# Patient Record
Sex: Male | Born: 1935 | Race: White | Hispanic: No | Smoking: Former smoker
Health system: Southern US, Community
[De-identification: ages and names within clinical notes are randomized; demographics above are authoritative.]

## PROBLEM LIST (undated history)

## (undated) DIAGNOSIS — N400 Enlarged prostate without lower urinary tract symptoms: Secondary | ICD-10-CM

## (undated) DIAGNOSIS — Z8679 Personal history of other diseases of the circulatory system: Secondary | ICD-10-CM

## (undated) DIAGNOSIS — I071 Rheumatic tricuspid insufficiency: Secondary | ICD-10-CM

## (undated) DIAGNOSIS — I519 Heart disease, unspecified: Secondary | ICD-10-CM

## (undated) DIAGNOSIS — I10 Essential (primary) hypertension: Secondary | ICD-10-CM

## (undated) DIAGNOSIS — R0989 Other specified symptoms and signs involving the circulatory and respiratory systems: Secondary | ICD-10-CM

## (undated) DIAGNOSIS — I2510 Atherosclerotic heart disease of native coronary artery without angina pectoris: Secondary | ICD-10-CM

## (undated) DIAGNOSIS — E785 Hyperlipidemia, unspecified: Secondary | ICD-10-CM

## (undated) DIAGNOSIS — I447 Left bundle-branch block, unspecified: Secondary | ICD-10-CM

## (undated) HISTORY — DX: Rheumatic tricuspid insufficiency: I07.1

## (undated) HISTORY — DX: Essential (primary) hypertension: I10

## (undated) HISTORY — DX: Hyperlipidemia, unspecified: E78.5

## (undated) HISTORY — DX: Benign prostatic hyperplasia without lower urinary tract symptoms: N40.0

## (undated) HISTORY — DX: Other specified symptoms and signs involving the circulatory and respiratory systems: R09.89

## (undated) HISTORY — DX: Personal history of other diseases of the circulatory system: Z86.79

## (undated) HISTORY — DX: Left bundle-branch block, unspecified: I44.7

## (undated) HISTORY — DX: Atherosclerotic heart disease of native coronary artery without angina pectoris: I25.10

## (undated) HISTORY — DX: Heart disease, unspecified: I51.9

---

## 2002-01-22 ENCOUNTER — Ambulatory Visit (HOSPITAL_COMMUNITY): Admission: RE | Admit: 2002-01-22 | Discharge: 2002-01-22 | Payer: Self-pay | Admitting: Family Medicine

## 2003-11-13 ENCOUNTER — Inpatient Hospital Stay (HOSPITAL_COMMUNITY): Admission: EM | Admit: 2003-11-13 | Discharge: 2003-11-18 | Payer: Self-pay | Admitting: Emergency Medicine

## 2003-11-13 ENCOUNTER — Ambulatory Visit: Payer: Self-pay | Admitting: Family Medicine

## 2003-11-15 ENCOUNTER — Encounter (INDEPENDENT_AMBULATORY_CARE_PROVIDER_SITE_OTHER): Payer: Self-pay | Admitting: Cardiology

## 2003-11-17 HISTORY — PX: CORONARY ANGIOPLASTY WITH STENT PLACEMENT: SHX49

## 2004-04-29 ENCOUNTER — Emergency Department (HOSPITAL_COMMUNITY): Admission: EM | Admit: 2004-04-29 | Discharge: 2004-04-29 | Payer: Self-pay | Admitting: Emergency Medicine

## 2004-05-19 ENCOUNTER — Ambulatory Visit (HOSPITAL_COMMUNITY): Admission: RE | Admit: 2004-05-19 | Discharge: 2004-05-19 | Payer: Self-pay | Admitting: Family Medicine

## 2007-11-24 ENCOUNTER — Ambulatory Visit: Payer: Self-pay | Admitting: Family Medicine

## 2007-11-24 ENCOUNTER — Inpatient Hospital Stay (HOSPITAL_COMMUNITY): Admission: EM | Admit: 2007-11-24 | Discharge: 2007-11-26 | Payer: Self-pay | Admitting: Emergency Medicine

## 2007-11-25 ENCOUNTER — Encounter: Payer: Self-pay | Admitting: Family Medicine

## 2007-11-26 ENCOUNTER — Ambulatory Visit: Payer: Self-pay | Admitting: Vascular Surgery

## 2009-02-09 IMAGING — CR DG FOOT COMPLETE 3+V*L*
3 series · 3 of 3 positions shown · non-contrast
Comparison: None.

CLINICAL DATA: Left foot pain localizing to the great toe.  No
recent injuries.

LEFT FOOT - COMPLETE 3+ VIEW 11/24/2007:

[view not recorded (1 of 3)]
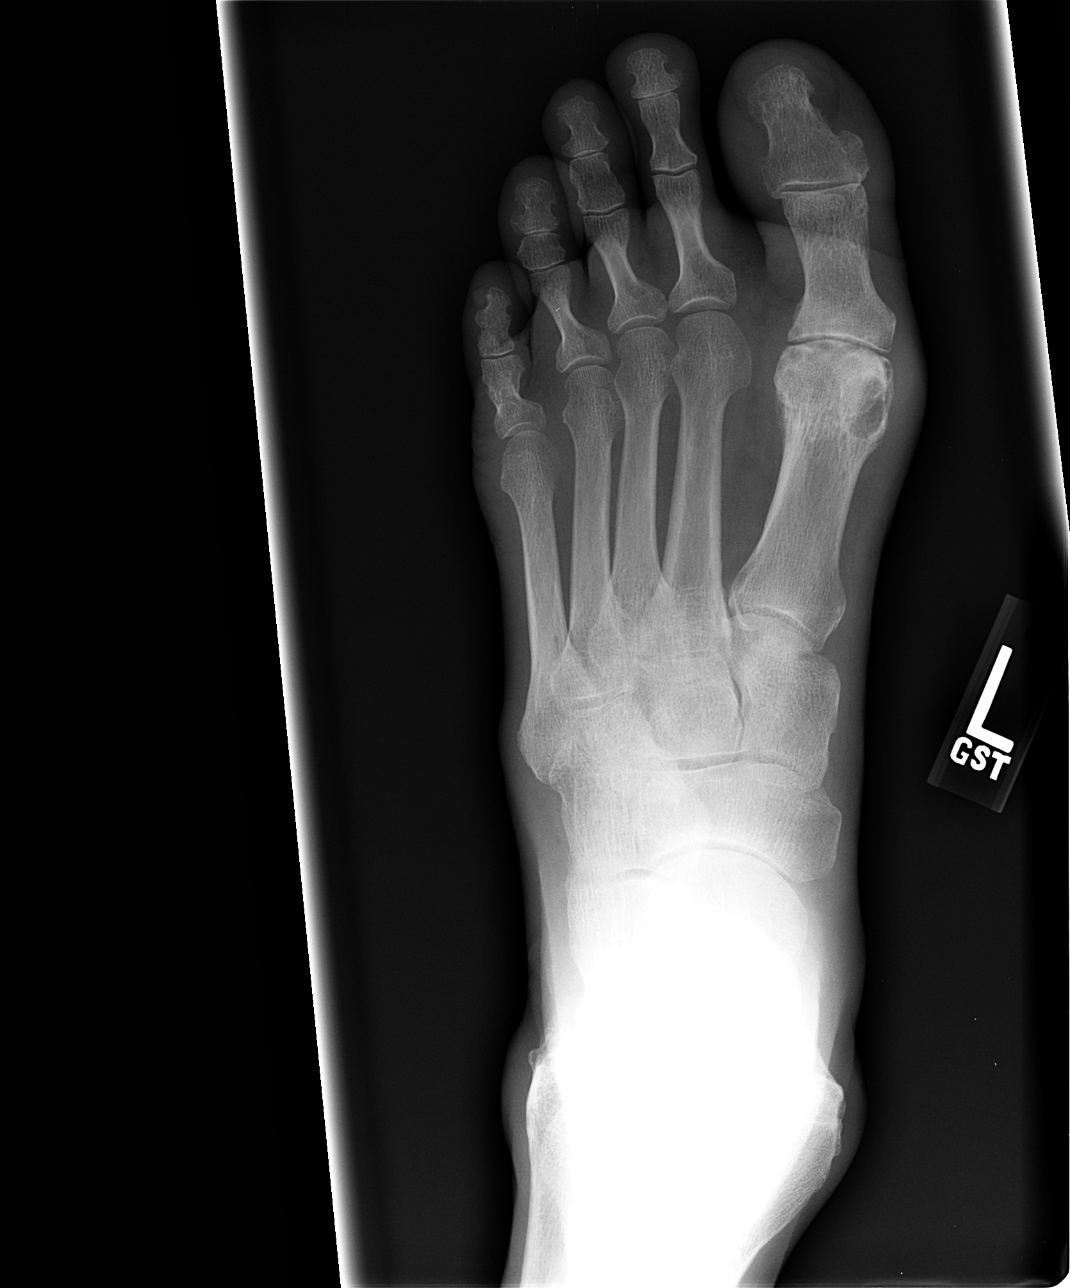

[view not recorded (2 of 3)]
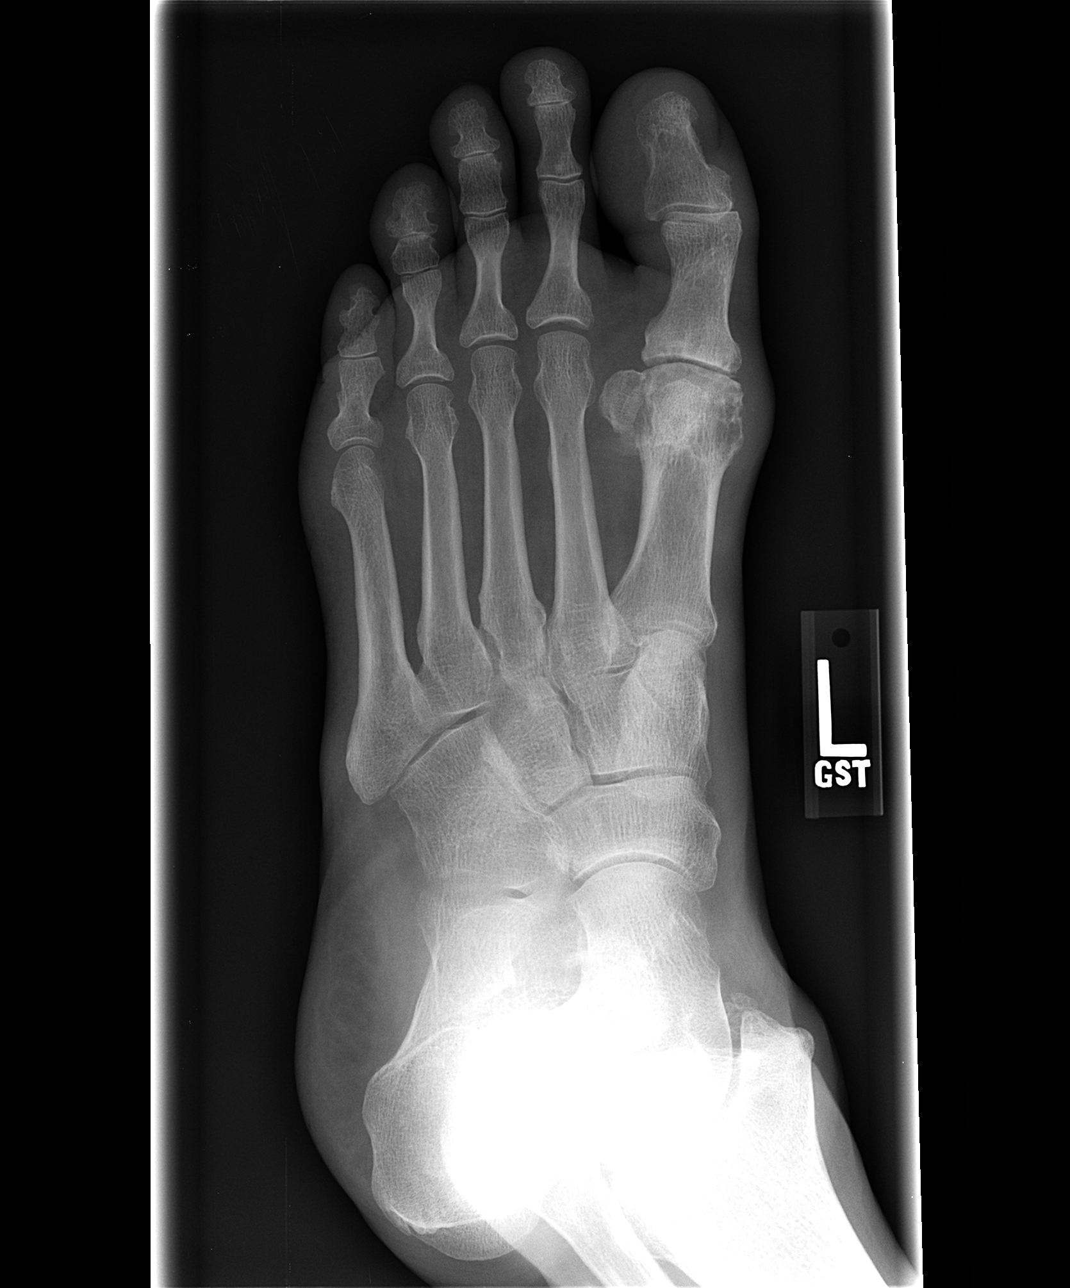

[view not recorded (3 of 3)]
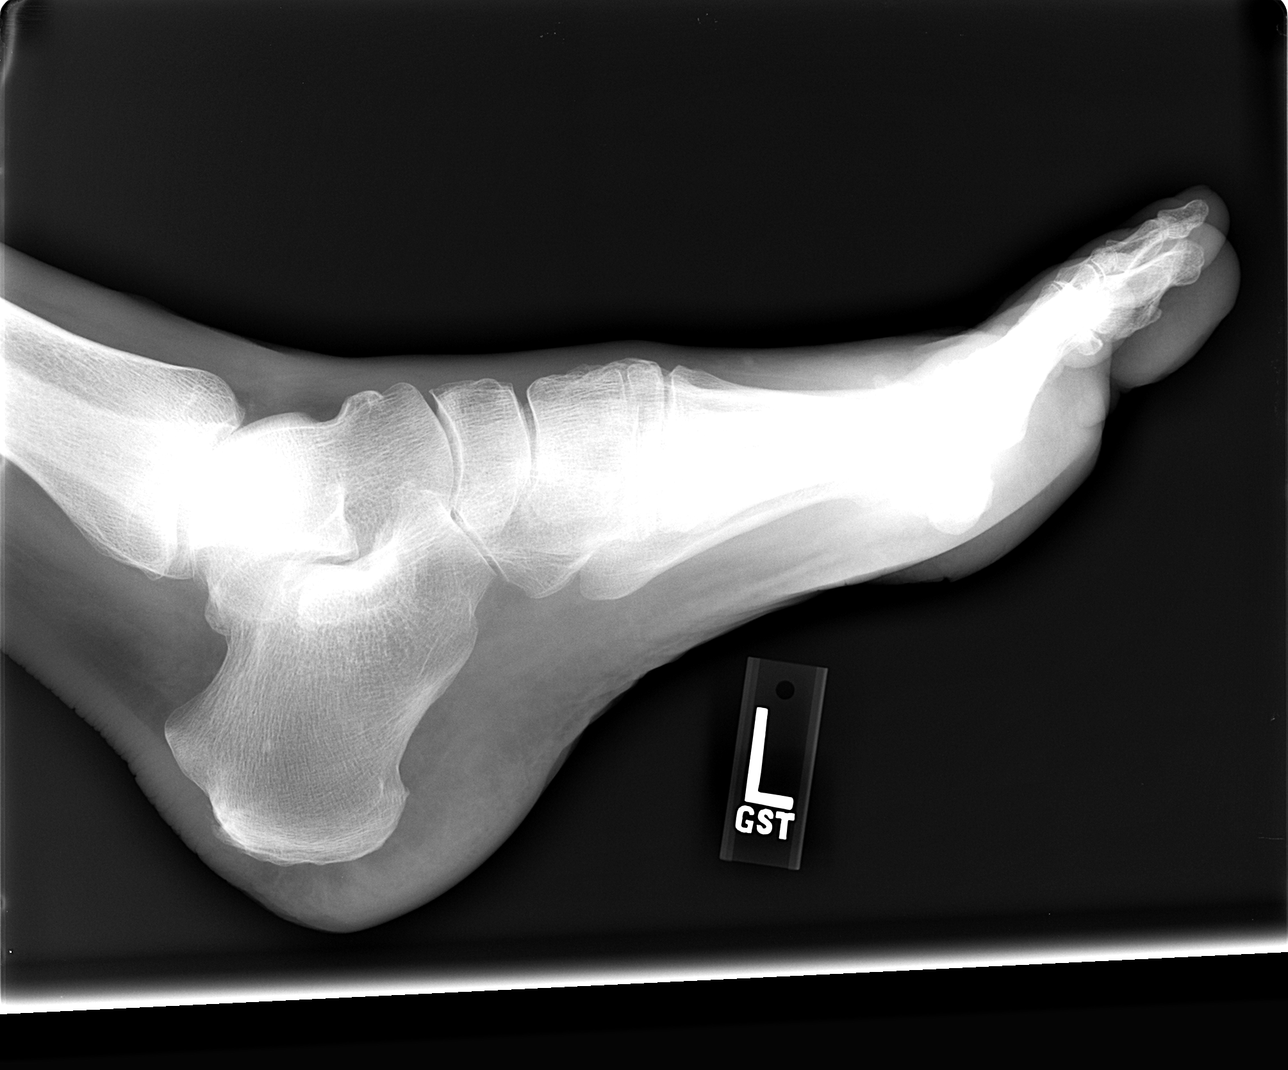

[3 of 3 positions shown; findings below may reference images not displayed]

FINDINGS: No evidence of acute or subacute fracture or dislocation.
Joint space narrowing involving the first MTP joint, with multiple
subchondral cysts, including a large geode in the head of the first
metatarsal.  No erosions.  Remaining joint spaces in the foot well
preserved.
IMPRESSION: Osteoarthritis involving the first MTP joint, moderate to severe in
degree.  No erosions to confirm a diagnosis of gout.  Remaining
joints of the foot unremarkable.

## 2009-10-13 ENCOUNTER — Ambulatory Visit (HOSPITAL_COMMUNITY): Admission: RE | Admit: 2009-10-13 | Discharge: 2009-10-13 | Payer: Self-pay | Admitting: Cardiovascular Disease

## 2009-10-13 HISTORY — PX: CARDIAC CATHETERIZATION: SHX172

## 2010-06-12 NOTE — Discharge Summary (Signed)
NAME:  Keith Jimenez, Keith Jimenez                  ACCOUNT NO.:  [id number]   MEDICAL RECORD NO.:  [id number]          PATIENT TYPE:  INP   LOCATION:  3707                         FACILITY:  [facility]   PHYSICIAN:  Keith Jimenez, M.D.DATE OF BIRTH:  [birth year]   DATE OF ADMISSION:  11/23/2007  DATE OF DISCHARGE:  11/26/2007                               DISCHARGE SUMMARY   PRIMARY CARE Keith Jimenez:  Keith Bushy, MD   DISCHARGE DIAGNOSES:  1. Left foot cellulitis.  2. Weakness.  3. Hypertension.   DISCHARGE MEDICATIONS:  1. Doxycycline 100 mg take b.i.d. for 10 days.  2. Benazepril 20 mg daily.  3. Simcor 500/20 mg take 1 tablet nightly.   DISCONTINUED MEDICATIONS:  Norvasc and benazepril combo and Cardura.   CONSULTATIONS:  Physical Therapy:  Physical Therapy saw the patient and  determined the patient to benefit from using a walker or cane for  ambulation; otherwise, he does not need any further follow up or home  physical therapy.   Reason for admission:  Pt admitted for chief complaint of weakness and  near syncope.   PROCEDURES:  None.   LABORATORY DATA:  Cardiac enzymes are negative x3, blood cultures no  growth today still pending, hemoglobin A1c 5.9, and TSH 1.052.  Admission CBC; white blood cells 10.8, hemoglobin 12.0, hematocrit 34.9,  platelets 153, and absolute neutrophil count 8.2.  Admission BMP; sodium  141, potassium 3.8, chloride 110, bicarb 25, BUN 21, creatinine 0.88,  glucose 116, and calcium 9.1.   IMAGING:  Left foot x-ray, osteoarthritis involving a first MTP joint  moderate-to-severe in degree.  No erosions to confirm the diagnosis of  gout.  Remaining joints of the foot are unremarkable.  Of note, there is  no sign of osteo.   BRIEF HOSPITAL COURSE:  1. Weakness and near syncope.  Keith Jimenez was admitted initially because      of feeling week while he was at home, although he denied ever      passing out.  He denied any chest pain or shortness of breath as   well on admission.  When he was at home feeling that he had a      neighbor, who measures blood pressure, which was found to be 80/40      at home.  He notes that he filled his prescriptions recently before      having this episode of hypertension including his blood pressure      medications, which included doxazosin 4 mg.  He notes that he took      a whole tablet instead of one half that he usually takes.  He was      also taking amlodipine and benazepril combo for his blood pressure.      On admission, we held all of his blood pressure medications and      gave him IV fluid resuscitation.  He did not have any more episodes      of weakness or near syncope while in the hospital.  We also ordered      EKGs as well as cardiac  enzymes to rule out MI on this      hospitalization.  His EKGs were normal and revealed a previous left      bundle-branch block as well as cardiac enzymes which did not      suggest the MI as they were all negative.  His blood pressures      improved during this hospitalization as we had been holding off his      blood pressure medications.  On discharge, we started him on      benazepril only due to his coronary artery disease, but held his      amlodipine as well as doxazosin because of the episode of      hypertension.  2. Left foot cellulitis.  When the patient presented to the emergency      room, he was noted to have cellulitis on his left foot as well as      some ulcerations on the plantar surface of his great toe as well as      his second toe.  Some streaking was noted on his shin as well.  He      denies any trauma to his foot.  Of note, he was taking doxycycline      previous to admission, which he said was not for cellulitis rather      for an infection that his daughter had prescribed for him 1 month      ago.  It is unclear how much doxycycline he had taken as this only      been filled a few days prior to admission.  On admission, we      stopped  his doxycycline and gave him IV vancomycin to treat the      presumed cellulitis.  He had also received 1 dose of Zosyn in the      emergency department.  During the hospitalization, the cellulitis      improved somewhat though the ulcerations were present.  He had      daily dressing changes with Bactroban ointment applied to the      ulcerations.  Upon further exam, he was found to have decreased      sensation marked in both lower extremities.  He had no ability to      distinguish moving the toe up or down, therefore decreased      proprioception as well as no ability to be distinguish sharp from      dull pinprick on his either lower extremities even including up to      his thighs.  Because of his relatively normal hemoglobin A1c at      5.9, we are not impressed and this is due to diabetic neuropathy.      We did order Dopplers of his lower extremities to evaluate for      arterial blood flow, which were found to be normal.  His pulses in      his feet and ankles were strong throughout hospitalization as well.      We recommend further follow up in the outpatient setting to      determine the cause of his neuropathy including possibly nerve      condition studies.  The decreased sensation on both of his feet      maybe the cause of the cellulitis.  No ulcerations as well as the      weakness and imbalance that he finds on walking.  We did have  Physical Therapy to evaluate the patient prior to discharge and      they found that he was able to ambulate on his own, but benefited      from using a walker or a cane.  They did not recommend any further      physical therapy in the outpatient setting, but did recommend that      he use a walker or cane at home.  3. Hyperlipidemia.  The patient was maintained on Simcor throughout      the hospitalization at his home dose.   DISCHARGE INSTRUCTIONS:  Diet:  Low sodium heart healthy.  Exercise:  Increase activity slowly and use a walker  or cane to ambulate  around the house.  Wound care:  Home health were provided dry dressing changes daily  including applying Bactroban ointment.  The home health service that he  will use is [facility], phone number [phone].   FOLLOWUP APPOINTMENTS:  Return to Dr. Purnell Shoemaker on Wednesday December 02, 2007 at 11:30 a.m. for an appointment, Dr. Harlene Salts phone number is [phone].   DISCHARGE CONDITION:  Improved and stable.      Asher Muir, MD  Electronically Signed      Keith Jimenez, M.D.  Electronically Signed    SO/MEDQ  D:  11/26/2007  T:  11/27/2007  Job:  [id number]   cc:   Keith Jimenez, M.D.

## 2010-06-12 NOTE — H&P (Signed)
NAME:  Keith Jimenez, Keith Jimenez                  ACCOUNT NO.:  [id number]   MEDICAL RECORD NO.:  [id number]          PATIENT TYPE:  INP   LOCATION:  3707                         FACILITY:  [facility]   PHYSICIAN:  Pearlean Brownie, M.D.DATE OF BIRTH:  [birth year]   DATE OF ADMISSION:  11/23/2007  DATE OF DISCHARGE:                              HISTORY & PHYSICAL   CHIEF COMPLAINT:  Weakness.   PRIMARY CARE PHYSICIAN:  Lianne Bushy, MD   CARDIOLOGY:  Cristy Hilts. Jacinto Halim, MD   HISTORY OF PRESENT ILLNESS:  This is an 77 year old male with weakness  today while feeding his wife with Cherlynn Polo disease.  He said he felt  weak in his back and also lightheaded.  Sitting down made him feel  better.  He felt like he was going to pass out.  He denied chest pain,  he denied shortness of breath.  He said he feels much better now.  He  has had a similar episode of weakness in 2005 and that was when his  stents were placed.  He says this does feel different, however, and not  as severe.  He had low blood pressure at home during this episode today.  A neighbor who works with the fire department checked his blood pressure  and it was noted to be 80/40.  Of note, the patient filled his  prescriptions today for doxazosin 4 mg and he took a whole tablet  instead of one-half as well as an amlodipine and benazepril combo. which  was also new.  He was given this prescription over a month ago, but says  he filled it today for the first time and took them all this afternoon.  Of note, the patient was noted to have a cellulitis on exam in the  emergency room.  When asked, the patient says he has had a fever in  his foot for about a day or so, however then later says he noticed some  kind of lesions on his left foot about 4 or 5 days ago.  He says that  the fever is worse today.  He denies systemic fevers.  He is not sure  how the lesion and cellulitis started.  He does not remember injuring  his foot.   PAST MEDICAL HISTORY:   Hypertension, coronary artery disease with stents  placed in 2005, hyperlipidemia, BPH.  He has an EF of 30-40% on an echo  and 53% on Cardiolite done in 2005.   MEDICATIONS:  1. Zencore 1020 nightly.  2. Doxazosin 4 mg one-half to one tab nightly.  3. Doxycycline 100 mg p.o. b.i.d. started today.  4. Amlodipine/benazepril 5/20 p.o. daily also started today.   ALLERGIES:  None.   SOCIAL HISTORY:  Lives with his wife who is with Cherlynn Polo disease.  He denies alcohol, denies tobacco use, although used to smoke and quit  many years ago, unable to give an extensive smoke history.  He is a  retired Nurse, children's.   FAMILY HISTORY:  He has had father who had a heart attack, but does not  know  any other family history.   SURGICAL:  He has stents and bilateral cataract surgery.   REVIEW OF SYSTEMS:  Denies nausea, vomiting, diarrhea, constipation,  diaphoresis, rash, headache, cough, fevers, urinary problems.  See HPI  for details.   PHYSICAL EXAMINATION:  VITAL SIGNS:  Temperature 98.3, heart rate 71-75,  blood pressure 141/74 and 112/68, respiratory rate is 16, and O2 is 96%  on room air.  GENERAL:  Not in acute distress, well appearing.  HEENT:  Pupils are equal, round, and reactive to light.  His extraocular  muscles were intact.  He has got moist mucous membranes.  NECK:  No JVD.  CARDIOVASCULAR:  Regular rate and rhythm.  No rubs, gallops, or murmurs.  PULMONARY:  Clear to auscultation bilaterally.  ABDOMEN:  Soft, nontender, positive bowel sounds.  EXTREMITIES:  See skin exam; he has no lower extremity edema except for  his left foot.  NEUROLOGIC:  He is oriented x3.  He knows who the president is.  His  cranial nerves are grossly intact.  His deep tendon reflexes are equal  bilaterally.  He has got equal sensation to light, touch in his  bilateral lower extremities.  MUSCULOSKELETAL:  He has 5/5 upper and lower extremities strength.  SKIN:  His left foot has  erythema surrounding the great toe and  streaking towards to ankle.  He has got an edema of his foot, especially  over the great toe.  The plantar surface of the foot has approximately  1.5 cm scabbed ulcer on the plantar surface of the great toe and small  ulcer on the second and fourth toes.  He also appears to have blisters  at the base of his metatarsal on the first and second toe of his left  foot.   LABORATORIES:  Lactic acid 1.1.  I-stat showed sodium 138, potassium 4,  chloride 104, bicarb 26, BUN 28, creatinine 1.3, and glucose 127.  CBC  shows a white count of 10.8, 12 hemoglobin and hematocrit 34.9, and  platelets 153.  He has ANC of 8.2.   ASSESSMENT:  An 77 year old male with weakness, and cellulitis.  Problem #1.  Weakness:  Baseline history of low blood pressure in the  field as well as 3 new blood pressure medications.  The patient likely  had symptomatic hypotension.  He is a poor historian, so I can not be  certain of this.  He did go to Dr. Purnell Shoemaker a month ago, but the meds were  filled today.  In the morning, we can assess whether the patient got  these as refill, or were they indeed new prescription, so I am calling  the PCP at the pharmacy.  I am going to hold all blood pressure  medications for now.  I will resume them one by one.  We should start  with Norvasc.  ? He is not on hydrochlorothiazide or beta blocker.  We  will keep the patient on telemonitor.  Problem #2.  Cellulitis:  Mild cellulitis at this point.  There is a low  possibility that his hypotension is related to a systemic infection.  However, it resolved without any treatment.  Blood cultures were  obtained and until we will get some preliminary results , we will  continue vancomycin.  He was given vancomycin and Zosyn in the emergency  room, however I feel we can discontinue the Zosyn as of now.  The  patient has no significant risk factors for MRSA so hopefully we can  switch him over to p.o.  dicloxacillin or Keflex soon.  If it is  methicillin-resistant Staphylococcus aureus, we will switch him to p.o.  doxycycline as started by his PCP.  The patient thinks the redness has  improved since arrival which is a good sign.  He has a normal CBC.  Tylenol for pain.  We will get left lower extremity x-ray to rule out  osteo.  We will get a wound care consult if these ulcers appear to be  unstageable ulcers, they almost resemble burns.  Problem #3.  Hypertension, see #1, hold medications.  Problem #4.  Coronary artery disease:  He is a poor historian, although  denies chest pain.  However, he had a similar episode with his  questionable MI in 2005.  I will check a TSH and cycle his enzyme and  put him on the tele bed.  I cannot find an EKG in the emergency room,  therefore I will get one now.  It is not clear why the patient is not on  a beta blocker, but he mentions some low heart rate in the past and this  is likely the cause.  Problem #5.  Benign prostatic hypertrophy:  He is on doxazosin which I  hold for now.  I will resume if at all, at a low dose and follow his  urine output with strict I's and O's.  Problem #6.  Increased CBG:  On February 24, 2007, on i-STAT, this is  after he had not eaten in quite some time.  I will check an A1c as lower  extremity ulcers have me concerned for some decreased sensation which  you can see with diabetes.  Problem #7.  Hyperlipidemia, Zencore, we will continue this.  Problem #8.  Full-code status.  Problem #9.  Disposition pending improvement of his cellulitis and his  weakness.      Johney Maine, M.D.  Electronically Signed      Pearlean Brownie, M.D.  Electronically Signed    JT/MEDQ  D:  11/24/2007  T:  11/24/2007  Job:  [id number]

## 2010-06-15 NOTE — H&P (Signed)
NAME:  Keith Jimenez, Keith Jimenez                  ACCOUNT NO.:  [id number]   MEDICAL RECORD NO.:  [id number]          PATIENT TYPE:  INP   LOCATION:  2037                         FACILITY:  [facility]   PHYSICIAN:  Adrian Blackwater, MDDATE OF BIRTH:  February 19, 1921   DATE OF ADMISSION:  11/13/2003  DATE OF DISCHARGE:                                HISTORY & PHYSICAL   CHIEF COMPLAINT:  Nearly passed out.   HISTORY OF PRESENT ILLNESS:  This is an 77 year old, white male patient with  history of syncope at visit in July 2005, at [facility].  The patient woke up this morning, ate breakfast and felt fine.  While  getting dressed, he felt dizzy and sat down.  On the way to ER, he had  several dizzy spells, but no loss of consciousness, but weakness and  lightheadedness.  When EMS arrived, he was very diaphoretic with slight  shortness of breath.  O2 saturations were 98% on room air.  Blood pressure  was 70/60 and received a bolus of 750 cc normal saline.  Blood pressure  increased to 90/40.  O2 saturations was 98% afterwards.  CBG was 172 and EKG  with LBBB.  He denies chest pain, denies shortness of breath.  The patient  had similar episode 1 year ago and had cardiovascular workup within normal  limits.  Yesterday, he worked the whole afternoon, but did not drink much  water, probable 0.25 L with some tea and coffee.  Denies any palpitations or  chest pain now.  No history of fever or cold.  Denies nausea, vomiting or  diarrhea.   PAST MEDICAL HISTORY:  1.  Hypertension.  2.  BPH.  3.  Hypercholesterolemia.   PAST SURGICAL HISTORY:  Bilateral cataract opacification.   ALLERGIES:  No known drug allergies.   FAMILY HISTORY:  Father died with an irregular heart rate requiring  pacemaker implantation.  He also had high blood pressure.   MEDICATIONS:  1.  Enteric coated aspirin 325 mg p.o. q.d.  2.  Lotrel 5/20 one tablet q.d.  3.  Terazosin one capsule q.d.  4.  Diazepam 5 mg  q.12h. p.r.n.  5.  Naproxen 200 mg b.i.d.  6.  Hydrochlorothiazide 35 mg 1/2 tablet q.d. (the patient was taking      hydrochlorothiazide p.r.n for sleep).  7.  Advicor 500/20 one tablet q.h.s.   FOLLOW UP:  The patient has follow-up appointment in 6 months.   SOCIAL HISTORY:  The patient lived with wife for more than 50 years.  Has  been depressed after only son passed away with brain tumor.  The patient has  no grand kids and wife's family is very supportive.   REVIEW OF SYSTEMS:  CONSTITUTIONAL:  See HPI.  GASTROINTESTINAL:  Occasional  constipation.   LABORATORY DATA AND X-RAY FINDINGS:  CBC with hemoglobin 13.4, hematocrit  37.8, white blood cells 8.9, platelets 225.  CMET with sodium 138, potassium  4.4, chloride 107, CO2 24, BUN 31, creatinine 1.4, glucose 116, magnesium 2,  calcium 9.4.  Urinalysis negative.  AST 24, ALT 21,  Alk phos 57, total  bilirubin 0.8, total protein 6.2.  PTT 26.   EKG with LBBB.   PHYSICAL EXAMINATION:  VITAL SIGNS:  Temperature 97.3, peripheral pulse 73,  respirations 24, blood pressure 147/70, O2 saturations 96% on room air.  GENERAL:  NAD.  Well-dressed.  Good hygiene.  NECK:  Supple.  Thyroid nonpalpable.  No carotid bruits.  HEENT:  PERRLA.  EOMI.  Oropharynx within normal limits.  Upper dentures.  TMs clear.  CARDIAC:  S1, S2 normal.  No murmurs, rubs or gallops.  Negative JVD.  LUNGS:  Clear to auscultation bilaterally.  No wheezes, rhonchi or rales.  Good respiratory effort.  ABDOMEN:  Positive bowel sounds.  Soft, nontender.  EXTREMITIES:  No edema.  NEUROLOGIC:  A&O x3.  Cranial nerves 2-12 grossly intact.  Good muscle  strength and symmetrical.   ASSESSMENT/PLAN:  This is an 77 year old, white male with history of  hypertension, hypercholesterolemia and benign prostatic hypertrophy who was  admitted for near syncopal episode workup.   1.  Near syncope.  Possible etiology is dehydration.  The patient will be      replenished with  water.  He has been taking hydrochlorothiazide p.r.n.      for insomnia.  Will rehydrate with normal saline plus 30 mEq/L of      potassium.  Will check CMP for electrolytes later.  Will like to rule      out silent myocardial infarction or arrhythmia, but the patient has no      history of chest pain, palpitations or shortness of breath.  We ordered      an electrocardiogram and cardiac enzymes x3.  Will check orthostatics      now and in the morning.  2.  Infection.  Will order complete blood count and urine culture.  3.  Hypertension, stable.  Will continue enteric coated aspirin and Lotrel,      Norvasc and Lotensin 20 one p.o. q.d.  Will hold hydrochlorothiazide for      now.  4.  The patient is symptomatic.  Will repeat electrocardiogram and get      records from primary care office.  5.  Benign prostatic hypertrophy.  Continue Terazosin 5 mg p.o. q.h.s.  6.  Hypercholesterolemia.  Continue Advicor 20/500 one p.o. q.h.s.       IM/MEDQ  D:  11/13/2003  T:  11/13/2003  Job:  [id number]

## 2010-06-15 NOTE — Discharge Summary (Signed)
NAME:  Keith Jimenez, Keith Jimenez                  ACCOUNT NO.:  [id number]   MEDICAL RECORD NO.:  [id number]          PATIENT TYPE:  INP   LOCATION:  6524                         FACILITY:  [facility]   PHYSICIAN:  Wayne A. Sheffield Slider, M.D.    DATE OF BIRTH:  22-Dec-1921   DATE OF ADMISSION:  11/13/2003  DATE OF DISCHARGE:  11/18/2003                                 DISCHARGE SUMMARY   PRIMARY CARE PHYSICIAN:  Lianne Bushy, M.D.   CONSULTATIONSVonna Kotyk R. Jacinto Halim, M.D.   DISCHARGE DIAGNOSES:  1.  Coronary artery disease with right coronary artery stent placement.  2.  Hypertension.  3.  Hypercholesterolemia.  4.  Benign prostatic hypertrophy.   PROCEDURES:  1.  An echocardiogram which showed left ventricular ejection fraction of 30      to 40%, with normal left ventricular wall thickness, as well as an      eccentric hypertrophy of the left ventricle and asymmetric septal      hypertrophy, mild mitral valvular regurgitation, and possible small      pericardial effusion circumferential to the heart.  2.  Cardiolite test that showed ejection fraction approximately 53%, large      fixed defect involving the inferior and septal wall, and a stress      induced ischemia in the anterior apex.  3.  A cardiac catheterization that resulted in stenting of the mid right      coronary artery.   DISCHARGE MEDICATIONS:  1.  Plavix 75 mg p.o. daily.  2.  Aspirin 325 mg p.o. at night 30 minutes before his Advicor.  3.  Advicor 500/20 mg p.o. every evening.  4.  Lotensin 20 mg p.o. daily.  5.  Norvasc 10 mg p.o. daily.  6.  The patient is instructed not to take his hydrochlorothiazide until he      follows up with Dr. Purnell Shoemaker.  7.  Nitroglycerin sublingual p.r.n.   HISTORY OF PRESENT ILLNESS:  This is an 77 year old white male with a past  history of syncope in July 2005, who on the morning of admission had  experienced some dizziness and lightheadedness with standing, but no loss of  consciousness.  When EMS arrived to  evaluate him he had been very  diaphoretic with slight shortness of breath with normal O2 saturations, a  blood pressure of 70/66.  He received a bolus of 250 cc of normal saline by  EMS.  Blood pressure increased to 90/40.  CBG was 172 at the time, and EKG  showed a left bundle branch block.  He denied any chest pain, shortness of  breath, and had a similar episode like this approximately one year ago with  a cardiovascular workup that showed everything to be normal.  The day prior  to admission he admitted to having worked most of the afternoon outside  without drinking much liquids except for tea and coffee.  No fever or recent  illness, nausea or vomiting, or diarrhea.   ADMISSION LABORATORY DATA:  White blood cells 8.9, hemoglobin 13.4,  hematocrit 37.8, platelets 225.  Sodium 138, potassium  4.4, chloride 107,  BUN 31, creatinine 1.4, glucose 116.  Initial cardiac markers showed CK of  211, CK-MB of 8.6, relative index of 4.1, troponin-I of 0.01, which on final  reading were a creatinine kinase of 218, CK-MB of 7.4, and a relative index  of 3.4, with a troponin-I of 0.01.   HOSPITAL COURSE:  #1 -  CORONARY ARTERY DISEASE WITH RIGHT CORONARY ARTERY  STENT PLACEMENT:  The patient was admitted for near syncope, rule out MI.  He was successfully rehydrated with saline and potassium, and subsequently  evaluated by cardiology who results are as listed previously in procedures.  The patient tolerated all procedures well, and will be followed by Dr. Jacinto Halim  for followup of his stent placement.  He is put on 72 hours of light  activity after stent placement on day prior to admission.  Additional  history, the patient had been taking his hydrochlorothiazide for what he  thought was to help him with sleep, not realizing that it is a diuretic.  He  has been instructed to no longer take this until he follows up with Dr.  Purnell Shoemaker.  #2 -  HYPERTENSION:  The patient had elevated blood pressure's once  he was  rehydrated.  He was started on Norvasc 10 mg daily, as well as Lotensin 20  mg daily.  His blood pressure's remained in better control.  #3 -  HYPERCHOLESTEROLEMIA:  The patient was on Advicor on admission.  He  was continued on this at discharge.  His lipid panel while in the hospital  showed elevated triglycerides of 296, as well as low HDL of 34.  The  patient's lipid levels will need to be followed up by his primary care  physician.  #4 -  BENIGN PROSTATIC HYPERTROPHY:  The patient was taking Terazosin for  his benign prostatic hypertrophy, however, due to its potential to cause  hypotension, this was discontinued on discharge, and recommendation for  patient to use Flomax for his benign prostatic hypertrophy instead given his  age and his cardiovascular sensitivity.   DISCHARGE LABORATORY DATA:  Sodium 135, potassium 3.7, chloride 104, CO2 24,  BUN 14, creatinine 1.1, glucose 106.  WBC 9.2, hemoglobin 12.3, hematocrit  34.7, platelets 214.   FOLLOWUP ITEMS:  1.  Follow up with Dr. Jacinto Halim for followup of his stent placement.  2.  Follow up with Dr. Purnell Shoemaker as needed, especially for redosing      hydrochlorothiazide and potentially a prescription for Flomax.   DIET:  Low cholesterol and low salt diet.       TH/MEDQ  D:  11/22/2003  T:  11/22/2003  Job:  [id number]   cc:   Lianne Bushy, M.D.  [address]  [place]  [place] [address]  Fax: [phone]   Cristy Hilts. Jacinto Halim, MD  [address]  [place]  [place] [address]  Fax: [phone]

## 2010-06-15 NOTE — Cardiovascular Report (Signed)
NAME:  Keith Jimenez, Keith Jimenez NO.:  [id number]   MEDICAL RECORD NO.:  [id number]          PATIENT TYPE:  INP   LOCATION:  2037                         FACILITY:  [facility]   PHYSICIAN:  Cristy Hilts. Jacinto Halim, MD       DATE OF BIRTH:  02-08-1921   DATE OF PROCEDURE:  11/17/2003  DATE OF DISCHARGE:                              CARDIAC CATHETERIZATION   OPERATION/PROCEDURE:  1.  Left ventriculography.  2.  Coronary arteriography.  3.  Ascending aortogram.  4.  Abdominal aortogram.  5.  Selective left renal arteriography.  6.  Cutting balloon angioplasty in the mid right coronary artery.  7.  Stenting of the mid right coronary artery with a 2 x 28 mm Cypher.   INDICATIONS:  [name] is an 77 year old, very active gentleman with  history of syncope x2 a year-and-a-half ago and now again about a week ago.  He had normal cardiac enzymes in the form of elevated CKs, negative troponin  and negative myoglobin.  He had abnormal EKG which was new in the form of  left bundle branch block.  He underwent a Persantine Cardiolite stress test  which revealed inferoapical scar and anteroapical ischemia.  During this, he  was brought to the cardiac catheterization laboratory for definitive  diagnosis of coronary disease.   HEMODYNAMIC DATA:  Left ventricular pressure of 163/19 with an end diastolic  pressure was 25 mmHg.  The aortic pressure was 172/91 with a mean of 123  mmHg.  There was no pressure gradient across the aortic valve.   ANGIOGRAPHIC DATA:  Left ventricle.  Left ventricular systolic function was  in the lower limits of normal.  Ejection fraction is estimated at 50%.  There is generalized hypokinesis and inferolateral hypokinesis.   ASCENDING AORTOGRAM:  Ascending aortogram reveals three aortic valve cusps.  There is no evidence for vegetation.  No  evidence for aortic dissection.  There was mild unfolding of the aorta.   Right coronary.  Right coronary is a large caliber  vessel.  It gives rise to  a large PLA and has two moderate sized PDAs that comes more proximally than  usual.  There is a high-grade, 99% stenosis in its mid and about 80%  stenosis in the proximal segment of the mid RCA.  They are calcified.  The  distal lesion has a post stenotic mild ectasia.  There is a large RV branch  that comes off just distal to the second stenosis.  Again, there is  calcification noted in the RCA.   Left ventricle.  Left ventricle is a large caliber vessel.   Circumflex. The circumflex is a large caliber vessel.  It has mild luminal  irregularities.  It gives origin to an AV groove branch and continues with a  larger first marginal-1, with secondary branches.   Left anterior descending.  The left anterior descending is a large caliber  vessel.  It gives rise to a small diagonal-1 and a larger diagonal-2.  It  has mild luminal irregularities.   Right innominate and left subclavian artery.  The right innominate is widely  patent.  The right common carotid artery has an ostial 20% stenosis.  The  right subclavian is widely patent.  The RIMA is widely patent.   Left subclavian LIMA is widely patent.   ABDOMINAL AORTOGRAM:  Abdominal aortogram reveals the presence two renal  arteries, one on either side.  The left renal artery has an ostial 30-at  most 40% stenosis.  There was no damping noted with 6-French catheter  engagement of the ostium.   FINAL INTERPRETATION:  1.  Low normal left ventricular ejection fraction, around 50% with myoglobin      hypokinesis and inferolateral hypokinesis.  2.  High-grade stenosis of the mid right coronary artery which is mildly      calcified.  Long lesion.  3.  Mild noncritical coronary artery disease of the left system.  4.  Stenosis 30-40% of the left renal artery.   INTERVENTION DATA:  Successful PTCA and stenting of the mid RCA with a 2 x  28 mm Cypher at 16 atmospheres pressure.  The stenosis was reduced from 99%   to 0% with TIMI-3 to TIMI-3 flow maintained at the end of the procedure.  There was no evidence of dissection and no evidence of thrombus at the end  of the procedure.   Cutting balloon angioplasty was performed prior to stent implantation with a  2.75 x 10 mm cutting balloon.  There was difficulty in advancing the cutting  balloon at the tightest lesion site in the mid RCA.   RECOMMENDATIONS:  The patient will be continued on aspirin and Plavix for at  least a period of three to six months.  He will be sent home if stable in  the morning.  Again, continue aggresive modification of risk factors,  especially control of his lipids with an LDL goal of less than 70 and HDL  goal of greater than 40, if possible 45.   TECHNIQUE OF PROCEDURE:  Diagnostic cardiac catheterization under the usual  sterile precautions, using a 6-French right femoral artery access.  A 6-  [place] multipurpose pigtail catheter was advanced into the ascending aorta  over a 0.035 inch J-wire.  The catheter was then advanced into the left  ventricle and left ventricular pressures were monitored.  Hand contrast  injection of the left ventricle was performed both in the LAO and RAO  projection.  The catheter was flushed with saline and pulled back into the  ascending aorta and pressure gradient across the aortic valve was monitored.  The right coronary artery was selectively engaged and angiography was  performed.   Then the catheter was pulled through to the aorta and an ascending aortogram  was performed.  Then the catheter was pulled back into the abdominal aorta.  Abdominal aortogram and selective left renal arteriography were performed.  Then the catheter was pulled out of the body in the usual fashion.  A 6-  [place] Judkins left diagnostic catheter was utilized to engage the left  coronary artery.  Angiography was performed.  Then the catheter was pulled out of the body in the usual fashion and a 6-French pigtail  catheter was  advanced into the root of the aorta, and ascending aortogram in the LAO  projection was performed.  Then the catheter was pulled back in the usual  fashion.   TECHNIQUE:  The 6-French sheath was exchanged for a 7-French sheath.  Then a  7-French FR-4 guide was utilized to engage the right coronary artery  and  angiography was performed.  A 190 cm x 0.014 inch Asahi soft guidewire was  utilized to cross into the RCA and the lesion length was measured with the  help of the radio-opaque marker.  Then a 2.75 x 10 mm cutting balloon was  utilized to perform multiple arthrotomies through the mid RCA from 4 to 7  atmosphere pressures.  I had great difficulty in advancing the cutting  balloon at the tightest lesion but I was eventually successful.  Then after  the cutting balloon angioplasty was performed, angiography was repeated.  There is still 50-60% stenosis.  However, the artery did enlarge after the  balloon dilatation.  Then a 3 x 28 mm Cypher stent was advanced into the  right coronary artery and after carefully confirming the position of the  stent, the stent was deployed initially at 14 atmospheres of pressure for 53  seconds and then a second inflation at 16 atmospheres of pressure for 24  seconds was performed.  Then the balloon was deflated, pulled back into the  guiding catheter.  Then 200 mcg of intracoronary nitroglycerin was  administered.  Angiography was repeated in several views.  After confirming  the success, the guidewire was withdrawn, angiography repeated and guide  catheter disengaged and pulled out of body in the usual fashion.  The  patient tolerated the procedure well.  There was no immediate complication.  The patient did have a mild vagal episode at the end of the procedure and  was probably deemed to be secondary to a pill being stuck in his mouth, as  Plavix was given on the table.      Kirk Ruths  D:  11/17/2003  T:  11/17/2003  Job:   [id number]   cc:   Lianne Bushy, M.D.  [address]  [place]  [place] [address]  Fax: [phone]

## 2010-10-29 LAB — CULTURE, BLOOD (ROUTINE X 2)
Culture: NO GROWTH
Culture: NO GROWTH

## 2010-10-29 LAB — POCT I-STAT, CHEM 8
BUN: 28 — ABNORMAL HIGH
Calcium, Ion: 1.09 — ABNORMAL LOW
Chloride: 104
Creatinine, Ser: 1.3
Glucose, Bld: 127 — ABNORMAL HIGH
HCT: 35 — ABNORMAL LOW
Hemoglobin: 11.9 — ABNORMAL LOW
Potassium: 4
Sodium: 138
TCO2: 26

## 2010-10-29 LAB — CARDIAC PANEL(CRET KIN+CKTOT+MB+TROPI)
CK, MB: 4.8 — ABNORMAL HIGH
CK, MB: 5.7 — ABNORMAL HIGH
Relative Index: 3.4 — ABNORMAL HIGH
Relative Index: 3.5 — ABNORMAL HIGH
Total CK: 138
Total CK: 167
Troponin I: 0.01
Troponin I: 0.01

## 2010-10-29 LAB — CBC
HCT: 34.9 — ABNORMAL LOW
Hemoglobin: 12 — ABNORMAL LOW
MCHC: 34.5
MCV: 88.7
Platelets: 153
RBC: 3.93 — ABNORMAL LOW
RDW: 13.4
WBC: 10.8 — ABNORMAL HIGH

## 2010-10-29 LAB — BASIC METABOLIC PANEL
BUN: 21
CO2: 25
Calcium: 9.1
Chloride: 110
Creatinine, Ser: 0.88
GFR calc Af Amer: >60
GFR calc non Af Amer: >60
Glucose, Bld: 116 — ABNORMAL HIGH
Potassium: 3.8
Sodium: 141

## 2010-10-29 LAB — DIFFERENTIAL
Basophils Absolute: 0
Basophils Relative: 0
Eosinophils Absolute: 0.1
Eosinophils Relative: 1
Lymphocytes Relative: 14
Lymphs Abs: 1.5
Monocytes Absolute: 1
Monocytes Relative: 9
Neutro Abs: 8.2 — ABNORMAL HIGH
Neutrophils Relative %: 76

## 2010-10-29 LAB — LACTIC ACID, PLASMA: Lactic Acid, Venous: 1.1

## 2010-10-29 LAB — CK TOTAL AND CKMB (NOT AT ARMC)
CK, MB: 7.1 — ABNORMAL HIGH
Relative Index: 3.3 — ABNORMAL HIGH
Total CK: 216

## 2010-10-29 LAB — HEMOGLOBIN A1C
Hgb A1c MFr Bld: 5.9
Mean Plasma Glucose: 123

## 2010-10-29 LAB — TROPONIN I: Troponin I: 0.01

## 2010-10-29 LAB — TSH: TSH: 1.052

## 2010-10-29 LAB — GLUCOSE, CAPILLARY: Glucose-Capillary: 126 — ABNORMAL HIGH

## 2012-06-29 ENCOUNTER — Encounter: Payer: Self-pay | Admitting: Cardiology

## 2012-06-29 DIAGNOSIS — I447 Left bundle-branch block, unspecified: Secondary | ICD-10-CM

## 2012-06-29 DIAGNOSIS — I071 Rheumatic tricuspid insufficiency: Secondary | ICD-10-CM

## 2012-06-29 DIAGNOSIS — R0989 Other specified symptoms and signs involving the circulatory and respiratory systems: Secondary | ICD-10-CM

## 2012-06-29 DIAGNOSIS — I2510 Atherosclerotic heart disease of native coronary artery without angina pectoris: Secondary | ICD-10-CM

## 2012-06-29 DIAGNOSIS — I739 Peripheral vascular disease, unspecified: Secondary | ICD-10-CM | POA: Insufficient documentation

## 2012-06-29 DIAGNOSIS — Z8679 Personal history of other diseases of the circulatory system: Secondary | ICD-10-CM

## 2012-06-30 ENCOUNTER — Ambulatory Visit (INDEPENDENT_AMBULATORY_CARE_PROVIDER_SITE_OTHER): Payer: Medicare Other | Admitting: Cardiovascular Disease

## 2012-06-30 VITALS — BP 158/82 | HR 55 | Resp 16 | Ht 72.0 in | Wt 156.5 lb

## 2012-06-30 DIAGNOSIS — I447 Left bundle-branch block, unspecified: Secondary | ICD-10-CM

## 2012-06-30 DIAGNOSIS — I071 Rheumatic tricuspid insufficiency: Secondary | ICD-10-CM

## 2012-06-30 DIAGNOSIS — I2510 Atherosclerotic heart disease of native coronary artery without angina pectoris: Secondary | ICD-10-CM

## 2012-06-30 DIAGNOSIS — Z8679 Personal history of other diseases of the circulatory system: Secondary | ICD-10-CM

## 2012-06-30 DIAGNOSIS — I079 Rheumatic tricuspid valve disease, unspecified: Secondary | ICD-10-CM

## 2012-06-30 DIAGNOSIS — R0989 Other specified symptoms and signs involving the circulatory and respiratory systems: Secondary | ICD-10-CM

## 2012-06-30 MED ORDER — BENAZEPRIL HCL 40 MG PO TABS: 40.0000 mg | ORAL_TABLET | Freq: Every day | ORAL | Status: AC

## 2012-06-30 NOTE — Patient Instructions (Addendum)
Your physician has recommended you make the following change in your medication: increase benazepril to 40 mg daily Your physician recommends that you schedule a follow-up appointment in: 1 year

## 2012-07-07 ENCOUNTER — Encounter: Payer: Self-pay | Admitting: Cardiovascular Disease

## 2012-07-21 ENCOUNTER — Encounter: Payer: Self-pay | Admitting: Cardiovascular Disease

## 2012-07-21 NOTE — Assessment & Plan Note (Signed)
No clinical signs of right heart failure, minimally elevated pulmonary pressure by previous echo

## 2012-07-21 NOTE — Assessment & Plan Note (Signed)
No significant carotid obstruction by ultrasound November 2012, note retrograde flow through the right vertebral artery suggestive of proximal right subclavian artery stenosis/obstruction. Note blood pressure in the right upper extremity is roughly 10 mm of mercury lower than the left. No focal neurological symptoms and no claudication of the upper extremity.

## 2012-07-21 NOTE — Assessment & Plan Note (Signed)
He is angina free and asymptomatic, had no evidence of coronary stenoses by cardiac catheterization September 2011(false positive nuclear stress test with anteroseptal reversible defect, probably related to left bundle branch block). History of previous stent to the right coronary artery, inferior wall hypokinesis and mildly depressed left ventricular systolic function (EF 45-50% by echo 2011).

## 2012-07-21 NOTE — Assessment & Plan Note (Signed)
He has not had evidence of high-grade AV block and does not have clinical congestive heart failure, despite mildly depressed systolic function

## 2012-07-21 NOTE — Progress Notes (Signed)
Patient ID: Keith Jimenez, male   DOB: [birth year], 79 y.o.   MRN: [id number]     Reason for office visit Coronary artery disease, peripheral artery disease, atrial tachycardia  As always, Keith Jimenez is accompanied by sister-in-law She has been instrumental in preserving his health and making sure he is well fed and cared for. He has worsening hearing and vision secondary to presbycusis  and macular degeneration. Even these problems with communication he seems to have some degree of dementia and is getting more and more confused. She helps with his meals and laundry. She is interested in acquiring some assistance for him with activities of daily living. She is especially worried about somebody helping with his baths which she feels is be inappropriate for her to do.  From a cardiovascular standpoint, no new problems. He does not have angina, has not had palpitations or syncope and denies shortness of breath or lower showed edema  Not on File  Current Outpatient Prescriptions  Medication Sig Dispense Refill  . acetaminophen (TYLENOL) 325 MG tablet Take 650 mg by mouth every 6 (six) hours as needed for pain.      Marland Kitchen aspirin 81 MG tablet Take 81 mg by mouth daily.      . benazepril (LOTENSIN) 40 MG tablet Take 1 tablet (40 mg total) by mouth daily.  30 tablet  11  . Multiple Vitamins-Minerals (EYE VITAMINS PO) Take 1 capsule by mouth daily.      . naproxen sodium (ANAPROX) 220 MG tablet Take 220 mg by mouth as needed.      . simvastatin (ZOCOR) 40 MG tablet Take 40 mg by mouth every evening.       No current facility-administered medications for this visit.    Past Medical History  Diagnosis Date  . CAD (coronary artery disease)     stent-Cypher, to RCA10/05; last cath 09/2009 with non obstructive CAd  . HTN (hypertension)   . LBBB (left bundle branch block)   . Dyslipidemia     on statin  . Left carotid bruit     Last duplex 11/2010 without stenosis  . H/O atrial tachycardia   . LV  dysfunction     EF 45-50% by echo 10/06/09;by cath 10/13/09 EF 55-60%  . TR (tricuspid regurgitation)     moderate by echo 09/2009  . BPH (benign prostatic hyperplasia)     Past Surgical History  Procedure Laterality Date  . Coronary angioplasty with stent placement  11/17/2003    cypher stent to RCA  . Cardiac catheterization  10/13/2009    LAD 30-40%, stents in RCA patent, posterior arteries with mild diseaase    Family History  Problem Relation Age of Onset  . Alzheimer's disease Mother   . Heart disease Father     History   Social History  . Marital Status: Widowed    Spouse Name: N/A    Number of Children: N/A  . Years of Education: N/A   Occupational History  . Not on file.   Social History Main Topics  . Smoking status: Former Smoker    Quit date: 07/22/1978  . Smokeless tobacco: Not on file  . Alcohol Use: No  . Drug Use: No  . Sexually Active: Not on file   Other Topics Concern  . Not on file   Social History Narrative  . No narrative on file    Review of systems: The patient specifically denies any chest pain at rest or with exertion, dyspnea at  rest or with exertion, orthopnea, paroxysmal nocturnal dyspnea, syncope, palpitations, focal neurological deficits, intermittent claudication, lower extremity edema, unexplained weight gain, cough, hemoptysis or wheezing.  The patient also denies abdominal pain, nausea, vomiting, dysphagia, diarrhea, constipation, polyuria, polydipsia, dysuria, hematuria, frequency, urgency, abnormal bleeding or bruising, fever, chills, unexpected weight changes, mood swings, change in skin or hair texture, change in voice quality, allergic reactions or rashes, new musculoskeletal complaints other than usual "aches and pains".   PHYSICAL EXAM BP 158/82  Pulse 55  Resp 16  Ht 6' (1.829 m)  Wt 70.988 kg (156 lb 8 oz)  BMI 21.22 kg/m2 The right upper extremity blood pressure is roughly 10 mm Hg lower General: Alert, oriented x3,  no distress Head: no evidence of trauma, PERRL, EOMI, no exophtalmos or lid lag, no myxedema, no xanthelasma; normal ears, nose and oropharynx Neck: normal jugular venous pulsations and no hepatojugular reflux; brisk carotid pulses without delay and bilateral carotid bruits Chest: clear to auscultation, no signs of consolidation by percussion or palpation, normal fremitus, symmetrical and full respiratory excursions Cardiovascular: normal position and quality of the apical impulse, regular rhythm, normal first and paradoxically split second heart sounds, 2/6 systolic murmurs heard in both the aortic focus in the left lower sternal border, rubs or gallops Abdomen: no tenderness or distention, no masses by palpation, no abnormal pulsatility or arterial bruits, normal bowel sounds, no hepatosplenomegaly Extremities: no clubbing, cyanosis or edema; 2+ radial, ulnar and brachial pulses bilaterally; 2+ right femoral, posterior tibial and dorsalis pedis pulses; 2+ left femoral, posterior tibial and dorsalis pedis pulses; no subclavian or femoral bruits Neurological: grossly nonfocal   EKG: Sinus rhythm, left bundle branch block   BMET    Component Value Date/Time   NA 141 11/24/2007 1027   K 3.8 11/24/2007 1027   CL 110 11/24/2007 1027   CO2 25 11/24/2007 1027   GLUCOSE 116* 11/24/2007 1027   BUN 21 11/24/2007 1027   CREATININE 0.88 11/24/2007 1027   CALCIUM 9.1 11/24/2007 1027   GFRNONAA >60 11/24/2007 1027   GFRAA  Value: >60        The eGFR has been calculated using the MDRD equation. This calculation has not been validated in all clinical 11/24/2007 1027     ASSESSMENT AND PLAN CAD (coronary artery disease) He is angina free and asymptomatic, had no evidence of coronary stenoses by cardiac catheterization September 2011(false positive nuclear stress test with anteroseptal reversible defect, probably related to left bundle branch block). History of previous stent to the right coronary  artery, inferior wall hypokinesis and mildly depressed left ventricular systolic function (EF 45-50% by echo 2011).  LBBB (left bundle branch block) He has not had evidence of high-grade AV block and does not have clinical congestive heart failure, despite mildly depressed systolic function  H/O atrial tachycardia No clinically apparent recurrences since his last appointment  Retrograde flow right vertebral artery, suggestive of right subclavian artery stenosis/obstruction No significant carotid obstruction by ultrasound November 2012, note retrograde flow through the right vertebral artery suggestive of proximal right subclavian artery stenosis/obstruction. Note blood pressure in the right upper extremity is roughly 10 mm of mercury lower than the left. No focal neurological symptoms and no claudication of the upper extremity.  TR (tricuspid regurgitation) No clinical signs of right heart failure, minimally elevated pulmonary pressure by previous echo  Orders Placed This Encounter  Procedures  . EKG 12-Lead   Meds ordered this encounter  Medications  . Multiple Vitamins-Minerals (EYE VITAMINS PO)  Sig: Take 1 capsule by mouth daily.  . benazepril (LOTENSIN) 40 MG tablet    Sig: Take 1 tablet (40 mg total) by mouth daily.    Dispense:  30 tablet    Refill:  11    Shaton Lore  Thurmon Fair, MD, [facility] [phone] office [phone] pager

## 2012-07-21 NOTE — Assessment & Plan Note (Signed)
No clinically apparent recurrences since his last appointment

## 2013-07-05 ENCOUNTER — Ambulatory Visit (INDEPENDENT_AMBULATORY_CARE_PROVIDER_SITE_OTHER): Payer: Medicare Other | Admitting: Cardiovascular Disease

## 2013-07-05 ENCOUNTER — Encounter: Payer: Self-pay | Admitting: Cardiovascular Disease

## 2013-07-05 VITALS — BP 162/84 | HR 62 | Resp 16 | Ht 72.0 in | Wt 169.6 lb

## 2013-07-05 DIAGNOSIS — Z8679 Personal history of other diseases of the circulatory system: Secondary | ICD-10-CM

## 2013-07-05 DIAGNOSIS — I10 Essential (primary) hypertension: Secondary | ICD-10-CM

## 2013-07-05 DIAGNOSIS — I739 Peripheral vascular disease, unspecified: Secondary | ICD-10-CM

## 2013-07-05 DIAGNOSIS — I447 Left bundle-branch block, unspecified: Secondary | ICD-10-CM

## 2013-07-05 DIAGNOSIS — I2510 Atherosclerotic heart disease of native coronary artery without angina pectoris: Secondary | ICD-10-CM

## 2013-07-05 NOTE — Assessment & Plan Note (Signed)
No symptomatic events.

## 2013-07-05 NOTE — Patient Instructions (Signed)
Dr. Croitoru recommends that you schedule a follow-up appointment in: One year.   

## 2013-07-05 NOTE — Progress Notes (Signed)
Patient ID: Keith Jimenez, male   DOB: 07-Dec-1921, 80 y.o.   MRN: [id number]      Reason for office visit CAD   Keith Jimenez is an 80 -year-old man with a remote history of coronary disease. He received stents to the proximal and mid right coronary artery in 2005 (drug-eluting), but has not had any coronary event since. He has very mild cardiomyopathy with an estimated left ventricular ejection fraction of around 50%, mostly related to left bundle branch block induced asynchrony. Cardiac catheterization in September of 2011 showed a minor nonobstructive CAD (30% LAD, 20% left circumflex, patent stents in the right coronary artery), EF of 55-60% . He had a "false positive" nuclear perfusion study with an anterior wall defect, likely related to left bundle branch block. He has a history of paroxysmal atrial tachycardia but has not tolerated beta blockers due to bradycardia. He has systemic hypertension. He was taken off lipid-lowering therapy by his primary care physician.  He probably has some degree of peripheral arterial disease. He complains of occasional pain in his left leg which might represent intermittent claudication. He has retrograde flow in his right vertebral artery suggesting possible subclavian artery occlusion/stenosis.  The cardiovascular problems have not been an issue. His caregiver Keith Jimenez describes "mini strokes" over the last several months, but when discussing the symptoms in detail it sounds that she is simply describing the normal waxing and waning of dementia. He has "good days and bad days" as far as his memory and degree of disorientation. He clearly has some sundowning. He is no longer living in his own home but has moved in with Keith Jimenez. He walks on a daily basis on a large tract of family land. His memory problems are compounded by macular degeneration and worsening presbycusis (he refuses to wear hearing aids). He has not had focal weakness, loss of field of vision,  facial asymmetry or other events to suggest a true TIA or stroke.   No Known Allergies  Current Outpatient Prescriptions  Medication Sig Dispense Refill  . acetaminophen (TYLENOL) 325 MG tablet Take 650 mg by mouth every 6 (six) hours as needed for pain.      Marland Kitchen aspirin 81 MG tablet Take 81 mg by mouth daily.      . benazepril (LOTENSIN) 40 MG tablet Take 1 tablet (40 mg total) by mouth daily.  30 tablet  11  . donepezil (ARICEPT) 5 MG tablet Take 5 mg by mouth 2 (two) times daily.      Marland Kitchen gabapentin (NEURONTIN) 300 MG capsule Take 300 mg by mouth 2 (two) times daily.      . Multiple Vitamins-Minerals (EYE VITAMINS PO) Take 1 capsule by mouth daily.      . naproxen sodium (ANAPROX) 220 MG tablet Take 220 mg by mouth as needed.       No current facility-administered medications for this visit.    Past Medical History  Diagnosis Date  . CAD (coronary artery disease)     stent-Cypher, to RCA10/05; last cath 09/2009 with non obstructive CAd  . HTN (hypertension)   . LBBB (left bundle branch block)   . Dyslipidemia     on statin  . Left carotid bruit     Last duplex 11/2010 without stenosis  . H/O atrial tachycardia   . LV dysfunction     EF 45-50% by echo 10/06/09;by cath 10/13/09 EF 55-60%  . TR (tricuspid regurgitation)     moderate by echo 09/2009  .  BPH (benign prostatic hyperplasia)     Past Surgical History  Procedure Laterality Date  . Coronary angioplasty with stent placement  11/17/2003    cypher stent to RCA  . Cardiac catheterization  10/13/2009    LAD 30-40%, stents in RCA patent, posterior arteries with mild diseaase    Family History  Problem Relation Age of Onset  . Alzheimer's disease Mother   . Heart disease Father     History   Social History  . Marital Status: Widowed    Spouse Name: N/A    Number of Children: N/A  . Years of Education: N/A   Occupational History  . Not on file.   Social History Main Topics  . Smoking status: Former Smoker    Quit  date: 07/22/1978  . Smokeless tobacco: Not on file  . Alcohol Use: No  . Drug Use: No  . Sexual Activity: Not on file   Other Topics Concern  . Not on file   Social History Narrative  . No narrative on file    Review of systems: The patient specifically denies any chest pain at rest or with exertion, dyspnea at rest or with exertion, orthopnea, paroxysmal nocturnal dyspnea, syncope, palpitations, focal neurological deficits, lower extremity edema, unexplained weight gain, cough, hemoptysis or wheezing.  The patient also denies abdominal pain, nausea, vomiting, dysphagia, diarrhea, constipation, polyuria, polydipsia, dysuria, hematuria, frequency, urgency, abnormal bleeding or bruising, fever, chills, unexpected weight changes, mood swings, change in skin or hair texture, change in voice quality, auditory or visual problems, allergic reactions or rashes, new musculoskeletal complaints other than usual "aches and pains".   PHYSICAL EXAM BP 162/84  Pulse 62  Resp 16  Ht 6' (1.829 m)  Wt 169 lb 9.6 oz (76.93 kg)  BMI 23.00 kg/m2  General: Alert, oriented x2, no distress Head: no evidence of trauma, PERRL, EOMI, no exophtalmos or lid lag, no myxedema, no xanthelasma; normal ears, nose and oropharynx Neck: normal jugular venous pulsations and no hepatojugular reflux; brisk carotid pulses without delay and no carotid bruits Chest: clear to auscultation, no signs of consolidation by percussion or palpation, normal fremitus, symmetrical and full respiratory excursions Cardiovascular: normal position and quality of the apical impulse, regular rhythm, normal first and paradoxically split second heart sounds, no murmurs, rubs or gallops Abdomen: no tenderness or distention, no masses by palpation, no abnormal pulsatility or arterial bruits, normal bowel sounds, no hepatosplenomegaly Extremities: no clubbing, cyanosis or edema; 2+ radial, ulnar and brachial pulses bilaterally; 2+ right femoral,  1+ posterior tibial and dorsalis pedis pulses; 2+ left femoral, 1+ posterior tibial and dorsalis pedis pulses; no subclavian or femoral bruits Neurological: grossly nonfocal   EKG: Sinus rhythm, left bundle branch block  Lipid Panel  No results found for this basename: chol, trig, hdl, cholhdl, vldl, ldlcalc    BMET    Component Value Date/Time   NA 141 11/24/2007 1027   K 3.8 11/24/2007 1027   CL 110 11/24/2007 1027   CO2 25 11/24/2007 1027   GLUCOSE 116* 11/24/2007 1027   BUN 21 11/24/2007 1027   CREATININE 0.88 11/24/2007 1027   CALCIUM 9.1 11/24/2007 1027   GFRNONAA >60 11/24/2007 1027   GFRAA  Value: >60        The eGFR has been calculated using the MDRD equation. This calculation has not been validated in all clinical 11/24/2007 1027     ASSESSMENT AND PLAN CAD (coronary artery disease) Asymptomatic. Last revascularization procedures performed 10 years ago. Cardiac  catheterization in 2011 showed minor nonobstructive disease.  Retrograde flow right vertebral artery, suggestive of right subclavian artery stenosis/obstruction Normal blood pressure and pulse in the right radial. Asymptomatic. Duplex ultrasonography in 2012 did not show carotid or subclavian stenoses.  LBBB (left bundle branch block) No episodes of syncope or presyncope. He has manifested severe bradycardia with beta blockers in the past. Avoid all negative chronotropic agents.  H/O atrial tachycardia No symptomatic events.  HTN (hypertension) He always has a blood pressure that is relatively high in the doctor's office, lower home. His caregiver says he did not have time to take his morning medications. Have asked her to check his blood pressure at home. I would probably add a very low dose of diuretic if necessary. Avoid beta blockers or centrally acting calcium channel blockers to   Orders Placed This Encounter  Procedures  . EKG 12-Lead   Meds ordered this encounter  Medications  . gabapentin  (NEURONTIN) 300 MG capsule    Sig: Take 300 mg by mouth 2 (two) times daily.  Marland Kitchen donepezil (ARICEPT) 5 MG tablet    Sig: Take 5 mg by mouth 2 (two) times daily.    Shanitra Phillippi  Sanda Klein, MD, [facility] [phone] office [phone] pager

## 2013-07-05 NOTE — Assessment & Plan Note (Signed)
Normal blood pressure and pulse in the right radial. Asymptomatic. Duplex ultrasonography in 2012 did not show carotid or subclavian stenoses.

## 2013-07-05 NOTE — Assessment & Plan Note (Signed)
Asymptomatic. Last revascularization procedures performed 10 years ago. Cardiac catheterization in 2011 showed minor nonobstructive disease.

## 2013-07-05 NOTE — Assessment & Plan Note (Signed)
No episodes of syncope or presyncope. He has manifested severe bradycardia with beta blockers in the past. Avoid all negative chronotropic agents.

## 2013-07-05 NOTE — Assessment & Plan Note (Signed)
He always has a blood pressure that is relatively high in the doctor's office, lower home. His caregiver says he did not have time to take his morning medications. Have asked her to check his blood pressure at home. I would probably add a very low dose of diuretic if necessary. Avoid beta blockers or centrally acting calcium channel blockers to

## 2014-07-11 ENCOUNTER — Encounter: Payer: Self-pay | Admitting: Cardiovascular Disease

## 2014-07-11 ENCOUNTER — Ambulatory Visit (INDEPENDENT_AMBULATORY_CARE_PROVIDER_SITE_OTHER): Payer: Medicare Other | Admitting: Cardiovascular Disease

## 2014-07-11 VITALS — BP 162/84 | HR 77 | Ht 68.0 in | Wt 170.2 lb

## 2014-07-11 DIAGNOSIS — Z8679 Personal history of other diseases of the circulatory system: Secondary | ICD-10-CM

## 2014-07-11 DIAGNOSIS — I2510 Atherosclerotic heart disease of native coronary artery without angina pectoris: Secondary | ICD-10-CM | POA: Diagnosis not present

## 2014-07-11 DIAGNOSIS — I447 Left bundle-branch block, unspecified: Secondary | ICD-10-CM | POA: Diagnosis not present

## 2014-07-11 DIAGNOSIS — I739 Peripheral vascular disease, unspecified: Secondary | ICD-10-CM

## 2014-07-11 DIAGNOSIS — I10 Essential (primary) hypertension: Secondary | ICD-10-CM

## 2014-07-11 DIAGNOSIS — I071 Rheumatic tricuspid insufficiency: Secondary | ICD-10-CM | POA: Diagnosis not present

## 2014-07-11 NOTE — Patient Instructions (Signed)
Dr. Croitoru recommends that you schedule a follow-up appointment in: One year.   

## 2014-07-11 NOTE — Progress Notes (Signed)
Patient ID: Keith Jimenez, male   DOB: 1921-03-26, 81 y.o.   MRN: [id number]     Cardiology Office Note   Date:  07/12/2014   ID:  Keith Jimenez, DOB 1921-10-18, MRN [id number]  PCP:  Keith Miyamoto, MD  Cardiologist:  Keith Fair, MD   Chief Complaint  Patient presents with  . Annual Exam    Seen at [facility] Thursday for dehydration.  No complaints of chest pain or edema.  SOB with activity.      History of Present Illness: Keith Jimenez is a 81 y.o. male who presents for CAD  Keith Jimenez is accompanied as always by his caregiver, Keith Jimenez. He has no cardiac complaints. His dementia is slowly progressing. He has some increased frequency of paranoid behavior, afraid that others in the house may be stealing from him. He has not had any violent outbursts. He is very hard of hearing and has macular degeneration, compounding difficulties in communication. He has not had any focal neurological deficits to suggest a stroke. He is walking less than before.  Keith Jimenez is an 24 -year-old man with a remote history of coronary disease. He received stents to the proximal and mid right coronary artery in 2005 (drug-eluting), but has not had any coronary event since. He has very mild cardiomyopathy with an estimated left ventricular ejection fraction of around 50%, mostly related to left bundle branch block induced asynchrony. Cardiac catheterization in September of 2011 showed minor nonobstructive CAD (30% LAD, 20% left circumflex, patent stents in the right coronary artery), EF of 55-60% . He had a "false positive" nuclear perfusion study with an anterior wall defect, likely related to left bundle branch block. He has a history of paroxysmal atrial tachycardia but has not tolerated beta blockers due to bradycardia. He has systemic hypertension. He was taken off lipid-lowering therapy by his primary care physician. He probably has some degree of peripheral arterial disease. He complains of occasional  pain in his left leg which might represent intermittent claudication. He has retrograde flow in his right vertebral artery suggesting possible subclavian artery occlusion/stenosis.     Past Medical History  Diagnosis Date  . CAD (coronary artery disease)     stent-Cypher, to RCA10/05; last cath 09/2009 with non obstructive CAd  . HTN (hypertension)   . LBBB (left bundle branch block)   . Dyslipidemia     on statin  . Left carotid bruit     Last duplex 11/2010 without stenosis  . H/O atrial tachycardia   . LV dysfunction     EF 45-50% by echo 10/06/09;by cath 10/13/09 EF 55-60%  . TR (tricuspid regurgitation)     moderate by echo 09/2009  . BPH (benign prostatic hyperplasia)     Past Surgical History  Procedure Laterality Date  . Coronary angioplasty with stent placement  11/17/2003    cypher stent to RCA  . Cardiac catheterization  10/13/2009    LAD 30-40%, stents in RCA patent, posterior arteries with mild diseaase     Current Outpatient Prescriptions  Medication Sig Dispense Refill  . acetaminophen (TYLENOL) 325 MG tablet Take 650 mg by mouth every 6 (six) hours as needed for pain.    Marland Kitchen aspirin 81 MG tablet Take 81 mg by mouth daily.    . benazepril (LOTENSIN) 40 MG tablet Take 1 tablet (40 mg total) by mouth daily. 30 tablet 11  . donepezil (ARICEPT) 10 MG tablet Take 10 mg by mouth daily.  6  .  gabapentin (NEURONTIN) 300 MG capsule Take 300 mg by mouth 2 (two) times daily.    . Multiple Vitamins-Minerals (EYE VITAMINS PO) Take 1 capsule by mouth daily.    . QUEtiapine (SEROQUEL) 25 MG tablet Take 1 tablet by mouth at bedtime.     No current facility-administered medications for this visit.    Allergies:   Review of patient's allergies indicates no known allergies.    Social History:  The patient  reports that he quit smoking about 35 years ago. He does not have any smokeless tobacco history on file. He reports that he does not drink alcohol or use illicit drugs.    Family History:  The patient's family history includes Alzheimer's disease in his mother; Heart disease in his father.    ROS:  Please see the history of present illness.    Otherwise, review of systems positive for none.   All other systems are reviewed and negative.    PHYSICAL EXAM: VS:  BP 162/84 mmHg  Pulse 77  Ht  (1.727 m)  Wt 170 lb 3.2 oz (77.202 kg)  BMI 25.88 kg/m2 , BMI Body mass index is 25.88 kg/(m^2). Re-check blood pressure 131/80 mmHg General: Alert, oriented x3, no distress Head: no evidence of trauma, PERRL, EOMI, no exophtalmos or lid lag, no myxedema, no xanthelasma; normal ears, nose and oropharynx Neck: normal jugular venous pulsations and no hepatojugular reflux; brisk carotid pulses without delay and no carotid bruits Chest: clear to auscultation, no signs of consolidation by percussion or palpation, normal fremitus, symmetrical and full respiratory excursions Cardiovascular: normal position and quality of the apical impulse, regular rhythm, normal first and second heart sounds, no murmurs, rubs or gallops Abdomen: no tenderness or distention, no masses by palpation, no abnormal pulsatility or arterial bruits, normal bowel sounds, no hepatosplenomegaly Extremities: no clubbing, cyanosis or edema; 2+ radial, ulnar and brachial pulses bilaterally; 2+ right femoral, 1+ posterior tibial and dorsalis pedis pulses; 2+ left femoral, 1+ posterior tibial and dorsalis pedis pulses; no subclavian or femoral bruits Hard of hearing, otherwise Neurological: grossly nonfocal Psych: euthymic mood, full affect   EKG:  EKG is ordered today. The ekg ordered today demonstrates sinus rhythm with frequent PACs and left bundle branch block, QRS 168 ms, QTC 516 ms   Wt Readings from Last 3 Encounters:  07/11/14 170 lb 3.2 oz (77.202 kg)  07/05/13 169 lb 9.6 oz (76.93 kg)  06/30/12 156 lb 8 oz (70.988 kg)     ASSESSMENT AND PLAN:  CAD (coronary artery  disease) Asymptomatic. Last revascularization procedures performed 10 years ago. Cardiac catheterization in 2011 showed minor nonobstructive disease.  Retrograde flow right vertebral artery, suggestive of right subclavian artery stenosis/obstruction Normal blood pressure and pulse in the right radial. Asymptomatic. Duplex ultrasonography in 2012 did not show carotid or subclavian stenoses.  LBBB (left bundle branch block) No episodes of syncope or presyncope. He has manifested severe bradycardia with beta blockers in the past. Avoid all negative chronotropic agents.  H/O atrial tachycardia No symptomatic events.  HTN (hypertension) He always has a blood pressure that is relatively high in the doctor's office, lower at home. Pressure after he had a chance to settle down in our office today showed normal values at 131/80 mmHg  Dementia This is slowly progressing. Keith Jimenez has medical power of attorney. We discussed end-of-life issues related to [place]. I think it is quite reasonable that he have a DO NOT RESUSCITATE status in view of his advanced age, deteriorating quality  of life and multiple comorbid conditions. I shared that with Fayrene Fearing today and I think he agrees. It is always hard to know whether he is truly hearing everything we tell him.  Current medicines are reviewed at length with the patient today.  The patient does not have concerns regarding medicines.  The following changes have been made:  no change  Labs/ tests ordered today include:  Orders Placed This Encounter  Procedures  . EKG 12-Lead    Patient Instructions  Dr. Royann Shivers recommends that you schedule a follow-up appointment in: One year.       Joie Bimler, MD  07/12/2014 1:09 PM    Keith Fair, MD, [facility] [phone] office [phone] pager

## 2015-07-29 DEATH — deceased

## 2015-09-04 ENCOUNTER — Ambulatory Visit: Payer: PRIVATE HEALTH INSURANCE | Admitting: Cardiovascular Disease

## 2015-09-22 ENCOUNTER — Other Ambulatory Visit: Payer: Self-pay

## 2019-07-04 ENCOUNTER — Encounter: Payer: Self-pay | Admitting: Legal Medicine
